# Patient Record
Sex: Female | Born: 1995 | Race: Black or African American | Hispanic: No | Marital: Single | Smoking: Never smoker
Health system: Southern US, Community
[De-identification: ages and names within clinical notes are randomized; demographics above are authoritative.]

## PROBLEM LIST (undated history)

## (undated) DIAGNOSIS — F319 Bipolar disorder, unspecified: Secondary | ICD-10-CM

## (undated) DIAGNOSIS — F909 Attention-deficit hyperactivity disorder, unspecified type: Secondary | ICD-10-CM

---
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
## 2014-03-25 ENCOUNTER — Emergency Department (HOSPITAL_COMMUNITY)
Admission: EM | Admit: 2014-03-25 | Discharge: 2014-03-26 | Disposition: A | Discharge status: Home / Self Care | Payer: No Typology Code available for payment source | Source: Home / Self Care | Attending: Emergency Medicine | Admitting: Emergency Medicine

## 2014-03-25 ENCOUNTER — Emergency Department (HOSPITAL_COMMUNITY): Payer: No Typology Code available for payment source

## 2014-03-25 ENCOUNTER — Encounter (HOSPITAL_COMMUNITY): Payer: Self-pay | Admitting: Emergency Medicine

## 2014-03-25 ENCOUNTER — Emergency Department (HOSPITAL_COMMUNITY)
Admission: EM | Admit: 2014-03-25 | Discharge: 2014-03-25 | Disposition: A | Discharge status: Home / Self Care | Payer: No Typology Code available for payment source | Attending: Emergency Medicine | Admitting: Emergency Medicine

## 2014-03-25 DIAGNOSIS — S81009A Unspecified open wound, unspecified knee, initial encounter: Secondary | ICD-10-CM | POA: Insufficient documentation

## 2014-03-25 DIAGNOSIS — Z79899 Other long term (current) drug therapy: Secondary | ICD-10-CM

## 2014-03-25 DIAGNOSIS — G8911 Acute pain due to trauma: Secondary | ICD-10-CM | POA: Insufficient documentation

## 2014-03-25 DIAGNOSIS — F319 Bipolar disorder, unspecified: Secondary | ICD-10-CM | POA: Insufficient documentation

## 2014-03-25 DIAGNOSIS — S93401A Sprain of unspecified ligament of right ankle, initial encounter: Secondary | ICD-10-CM

## 2014-03-25 DIAGNOSIS — W540XXA Bitten by dog, initial encounter: Secondary | ICD-10-CM

## 2014-03-25 DIAGNOSIS — S91051A Open bite, right ankle, initial encounter: Secondary | ICD-10-CM

## 2014-03-25 DIAGNOSIS — Y9389 Activity, other specified: Secondary | ICD-10-CM | POA: Insufficient documentation

## 2014-03-25 DIAGNOSIS — S81809A Unspecified open wound, unspecified lower leg, initial encounter: Principal | ICD-10-CM

## 2014-03-25 DIAGNOSIS — S93409A Sprain of unspecified ligament of unspecified ankle, initial encounter: Secondary | ICD-10-CM | POA: Insufficient documentation

## 2014-03-25 DIAGNOSIS — M25579 Pain in unspecified ankle and joints of unspecified foot: Secondary | ICD-10-CM

## 2014-03-25 DIAGNOSIS — Y929 Unspecified place or not applicable: Secondary | ICD-10-CM | POA: Insufficient documentation

## 2014-03-25 DIAGNOSIS — S91009A Unspecified open wound, unspecified ankle, initial encounter: Principal | ICD-10-CM

## 2014-03-25 DIAGNOSIS — Z23 Encounter for immunization: Secondary | ICD-10-CM | POA: Insufficient documentation

## 2014-03-25 DIAGNOSIS — M25571 Pain in right ankle and joints of right foot: Secondary | ICD-10-CM

## 2014-03-25 MED ORDER — AMOXICILLIN-POT CLAVULANATE 875-125 MG PO TABS: 1.0000 | ORAL_TABLET | Freq: Two times a day (BID) | ORAL | Status: AC

## 2014-03-25 MED ORDER — TETANUS-DIPHTH-ACELL PERTUSSIS 5-2.5-18.5 LF-MCG/0.5 IM SUSP
0.5000 mL | Freq: Once | INTRAMUSCULAR | Status: AC
  Administered 2014-03-25: 0.5 mL via INTRAMUSCULAR
  Filled 2014-03-25: qty 0.5

## 2014-03-25 MED ORDER — TRAMADOL HCL 50 MG PO TABS: 50.0000 mg | ORAL_TABLET | Freq: Four times a day (QID) | ORAL | Status: DC | PRN

## 2014-03-25 NOTE — ED Provider Notes (Signed)
CSN: [id number]     Arrival date & time 03/25/14  1659 History  This chart was scribed for non-physician practitioner, Junius FinnerErin O'Malley, PA-C, working with Nelia Shiobert L Beaton, MD by Smiley HousemanFallon Davis, ED Scribe. This patient was seen in room WTR8/WTR8 and the patient's care was started at 6:22 PM.     Chief Complaint  Patient presents with  . Animal Bite   The history is provided by the patient. No language interpreter was used.   HPI Comments: Tami Garza is a 18 y.o. female who presents to the Emergency Department complaining of an animal bite to her right ankle that occurred about an hour ago.  Pt was bit by her [employer] and states it is UTD on its vaccinations.  She states she was arguing with her aunt when the dog attempted to protect her and bit her multiple times.  Pt arrived by EMS with a dressing to her right ankle.  She is ambulatory.  She states her pain is a 5 out of 10, aching and sharp.  She reports it is difficult to extend her foot forward due to the swelling.  She states she had a previous sprain to her right ankle.  Pt reports her tetanus is not UTD.    Past Medical History  Diagnosis Date  . ADHD (attention deficit hyperactivity disorder)   . Bipolar disorder    History reviewed. No pertinent past surgical history. No family history on file. History  Substance Use Topics  . Smoking status: Never Smoker   . Smokeless tobacco: Not on file  . Alcohol Use: Yes   OB History   Grav Para Term Preterm Abortions TAB SAB Ect Mult Living                 Review of Systems  Constitutional: Negative for fever and chills.  Musculoskeletal: Positive for arthralgias (Right ankle) and joint swelling (Right ankle).  Skin: Positive for wound (Animal Bite). Negative for color change and rash.  Psychiatric/Behavioral: Negative for behavioral problems and confusion.  All other systems reviewed and are negative.   Allergies  Review of patient's allergies indicates no known  allergies.  Home Medications   Current Outpatient Rx  Name  Route  Sig  Dispense  Refill  . amoxicillin-clavulanate (AUGMENTIN) 875-125 MG per tablet   Oral   Take 1 tablet by mouth every 12 (twelve) hours.   14 tablet   0   . ARIPiprazole (ABILIFY) 10 MG tablet   Oral   Take 10 mg by mouth every morning.         . Oxcarbazepine (TRILEPTAL) 300 MG tablet   Oral   Take 600 mg by mouth 2 (two) times daily.          . traZODone (DESYREL) 50 MG tablet   Oral   Take 25 mg by mouth at bedtime as needed for sleep.          Triage Vitals: BP 116/81  Pulse 67  Temp(Src) 99 F (37.2 C) (Oral)  Resp 16  SpO2 100%  Physical Exam  Nursing note and vitals reviewed. Constitutional: She is oriented to person, place, and time. She appears well-developed and well-nourished. No distress.  HENT:  Head: Normocephalic and atraumatic.  Eyes: Conjunctivae and EOM are normal. Right eye exhibits no discharge. Left eye exhibits no discharge. No scleral icterus.  Neck: Normal range of motion.  Cardiovascular: Normal rate, regular rhythm and normal heart sounds.   Pulses:  Dorsalis pedis pulses are 2+ on the right side.       Posterior tibial pulses are 2+ on the right side.  Pulmonary/Chest: Effort normal and breath sounds normal. No respiratory distress. She has no wheezes. She has no rales. She exhibits no tenderness.  Abdominal: Soft. She exhibits no distension.  Musculoskeletal:       Right ankle: She exhibits decreased range of motion, swelling and laceration. She exhibits no deformity and normal pulse.  Neurological: She is alert and oriented to person, place, and time.  Skin: Skin is warm and dry. No rash noted. She is not diaphoretic.  Puncture wounds to anterior right ankle and posterior lateral right heel.  No active bleeding.      ED Course  Procedures (including critical care time) DIAGNOSTIC STUDIES: Oxygen Saturation is 100% on RA, normal by my interpretation.     COORDINATION OF CARE: 6:29 PM-Will order x-ray of right ankle and Tdap.  Patient informed of current plan of treatment and evaluation and agrees with plan.    The wound is cleansed, debrided of foreign material as much as possible, and dressed. The patient is alerted to watch for any signs of infection (redness, pus, pain, increased swelling or fever) and call if such occurs. Home wound care instructions are provided. Tetanus vaccination status reviewed: Tetanus was given today.    Imaging Review Dg Ankle Complete Right  03/25/2014   CLINICAL DATA:  Dog bite in the ankle.  EXAM: RIGHT ANKLE - COMPLETE 3+ VIEW  COMPARISON:  None.  FINDINGS: Spurring of the dorsal talar neck. No fracture or retained foreign body. No malleolar fracture. Talar dome intact.  IMPRESSION: 1. No fracture or retained foreign body. 2. Spurring of the dorsal talar neck.   Electronically Signed   By: Herbie Baltimore M.D.   On: 03/25/2014 19:19     MDM   Final diagnoses:  Dog bite of right ankle  Sprain of ankle, right   Pt presenting with dog bite to right ankle with associated ankle pain, swelling, and puncture wounds. Plain films: no fracture or retained foreign body.  Wounds cleansed and covered. Crutches given for comfort of apparent right ankle sprain. Pt has cam walker boot at home. Advised pt if she decides to use for ankle pain, to keep wounds cleaned and covered while wearing and to change bandages 2-3 times daily.  Rx: augmentin.  Tetanus given in ED. Return precautions provided. Pt verbalized understanding and agreement with tx plan.   I personally performed the services described in this documentation, which was scribed in my presence. The recorded information has been reviewed and is accurate.      Junius Finner, PA-C 03/26/14 1212

## 2014-03-25 NOTE — Discharge Instructions (Signed)
Animal Bite Animal bite wounds can get infected. It is important to get proper medical treatment. Ask your doctor if you need a rabies shot. HOME CARE   Follow your doctor's instructions for taking care of your wound.  Only take medicine as told by your doctor.  Take your medicine (antibiotics) as told. Finish them even if you start to feel better.  Keep all doctor visits as told. You may need a tetanus shot if:   You cannot remember when you had your last tetanus shot.  You have never had a tetanus shot.  The injury broke your skin. If you need a tetanus shot and you choose not to have one, you may get tetanus. Sickness from tetanus can be serious. GET HELP RIGHT AWAY IF:   Your wound is warm, red, sore, or puffy (swollen).  You notice yellowish-white fluid (pus) or a bad smell coming from the wound.  You see a red line on the skin coming from the wound.  You have a fever, chills, or you feel sick.  You feel sick to your stomach (nauseous), or you throw up (vomit).  Your pain does not go away, or it gets worse.  You have trouble moving the injured part.  You have questions or concerns. MAKE SURE YOU:   Understand these instructions.  Will watch your condition.  Will get help right away if you are not doing well or get worse. Document Released: 12/16/2005 Document Revised: 03/09/2012 Document Reviewed: 08/07/2011 [facility] Patient Information 2014 [place], [place].  Acute Ankle Sprain with Phase I Rehab An acute ankle sprain is a partial or complete tear in one or more of the ligaments of the ankle due to traumatic injury. The severity of the injury depends on both the the number of ligaments sprained and the grade of sprain. There are 3 grades of sprains.   A grade 1 sprain is a mild sprain. There is a slight pull without obvious tearing. There is no loss of strength, and the muscle and ligament are the correct length.  A grade 2 sprain is a moderate sprain. There  is tearing of fibers within the substance of the ligament where it connects two bones or two cartilages. The length of the ligament is increased, and there is usually decreased strength.  A grade 3 sprain is a complete rupture of the ligament and is uncommon. In addition to the grade of sprain, there are three types of ankle sprains.  Lateral ankle sprains: This is a sprain of one or more of the three ligaments on the outer side (lateral) of the ankle. These are the most common sprains. Medial ankle sprains: There is one large triangular ligament of the inner side (medial) of the ankle that is susceptible to injury. Medial ankle sprains are less common. Syndesmosis, "high ankle," sprains: The syndesmosis is the ligament that connects the two bones of the lower leg. Syndesmosis sprains usually only occur with very severe ankle sprains. SYMPTOMS  Pain, tenderness, and swelling in the ankle, starting at the side of injury that may progress to the whole ankle and foot with time.  "Pop" or tearing sensation at the time of injury.  Bruising that may spread to the heel.  Impaired ability to walk soon after injury. CAUSES   Acute ankle sprains are caused by trauma placed on the ankle that temporarily forces or pries the anklebone (talus) out of its normal socket.  Stretching or tearing of the ligaments that normally hold the joint in  place (usually due to a twisting injury). RISK INCREASES WITH:  Previous ankle sprain.  Sports in which the foot may land awkwardly (ie. basketball, volleyball, or soccer) or walking or running on uneven or rough surfaces.  Shoes with inadequate support to prevent sideways motion when stress occurs.  Poor strength and flexibility.  Poor balance skills.  Contact sports. PREVENTION   Warm up and stretch properly before activity.  Maintain physical fitness:  Ankle and leg flexibility, muscle strength, and endurance.  Cardiovascular fitness.  Balance  training activities.  Use proper technique and have a coach correct improper technique.  Taping, protective strapping, bracing, or high-top tennis shoes may help prevent injury. Initially, tape is best; however, it loses most of its support function within 10 to 15 minutes.  Wear proper fitted protective shoes (High-top shoes with taping or bracing is more effective than either alone).  Provide the ankle with support during sports and practice activities for 12 months following injury. PROGNOSIS   If treated properly, ankle sprains can be expected to recover completely; however, the length of recovery depends on the degree of injury.  A grade 1 sprain usually heals enough in 5 to 7 days to allow modified activity and requires an average of 6 weeks to heal completely.  A grade 2 sprain requires 6 to 10 weeks to heal completely.  A grade 3 sprain requires 12 to 16 weeks to heal.  A syndesmosis sprain often takes more than 3 months to heal. RELATED COMPLICATIONS   Frequent recurrence of symptoms may result in a chronic problem. Appropriately addressing the problem the first time decreases the frequency of recurrence and optimizes healing time. Severity of the initial sprain does not predict the likelihood of later instability.  Injury to other structures (bone, cartilage, or tendon).  A chronically unstable or arthritic ankle joint is a possiblity with repeated sprains. TREATMENT Treatment initially involves the use of ice, medication, and compression bandages to help reduce pain and inflammation. Ankle sprains are usually immobilized in a walking cast or boot to allow for healing. Crutches may be recommended to reduce pressure on the injury. After immobilization, strengthening and stretching exercises may be necessary to regain strength and a full range of motion. Surgery is rarely needed to treat ankle sprains. MEDICATION   Nonsteroidal anti-inflammatory medications, such as aspirin and  ibuprofen (do not take for the first 3 days after injury or within 7 days before surgery), or other minor pain relievers, such as acetaminophen, are often recommended. Take these as directed by your caregiver. Contact your caregiver immediately if any bleeding, stomach upset, or signs of an allergic reaction occur from these medications.  Ointments applied to the skin may be helpful.  Pain relievers may be prescribed as necessary by your caregiver. Do not take prescription pain medication for longer than 4 to 7 days. Use only as directed and only as much as you need. HEAT AND COLD  Cold treatment (icing) is used to relieve pain and reduce inflammation for acute and chronic cases. Cold should be applied for 10 to 15 minutes every 2 to 3 hours for inflammation and pain and immediately after any activity that aggravates your symptoms. Use ice packs or an ice massage.  Heat treatment may be used before performing stretching and strengthening activities prescribed by your caregiver. Use a heat pack or a warm soak. SEEK IMMEDIATE MEDICAL CARE IF:   Pain, swelling, or bruising worsens despite treatment.  You experience pain, numbness, discoloration, or coldness  in the foot or toes.  New, unexplained symptoms develop (drugs used in treatment may produce side effects.) EXERCISES  PHASE I EXERCISES RANGE OF MOTION (ROM) AND STRETCHING EXERCISES - Ankle Sprain, Acute Phase I, Weeks 1 to 2 These exercises may help you when beginning to restore flexibility in your ankle. You will likely work on these exercises for the 1 to 2 weeks after your injury. Once your physician, physical therapist, or athletic trainer sees adequate progress, he or she will advance your exercises. While completing these exercises, remember:   Restoring tissue flexibility helps normal motion to return to the joints. This allows healthier, less painful movement and activity.  An effective stretch should be held for at least 30  seconds.  A stretch should never be painful. You should only feel a gentle lengthening or release in the stretched tissue. RANGE OF MOTION - Dorsi/Plantar Flexion  While sitting with your right / left knee straight, draw the top of your foot upwards by flexing your ankle. Then reverse the motion, pointing your toes downward.  Hold each position for __________ seconds.  After completing your first set of exercises, repeat this exercise with your knee bent. Repeat __________ times. Complete this exercise __________ times per day.  RANGE OF MOTION - Ankle Alphabet  Imagine your right / left big toe is a pen.  Keeping your hip and knee still, write out the entire alphabet with your "pen." Make the letters as large as you can without increasing any discomfort. Repeat __________ times. Complete this exercise __________ times per day.  STRENGTHENING EXERCISES - Ankle Sprain, Acute -Phase I, Weeks 1 to 2 These exercises may help you when beginning to restore strength in your ankle. You will likely work on these exercises for 1 to 2 weeks after your injury. Once your physician, physical therapist, or athletic trainer sees adequate progress, he or she will advance your exercises. While completing these exercises, remember:   Muscles can gain both the endurance and the strength needed for everyday activities through controlled exercises.  Complete these exercises as instructed by your physician, physical therapist, or athletic trainer. Progress the resistance and repetitions only as guided.  You may experience muscle soreness or fatigue, but the pain or discomfort you are trying to eliminate should never worsen during these exercises. If this pain does worsen, stop and make certain you are following the directions exactly. If the pain is still present after adjustments, discontinue the exercise until you can discuss the trouble with your clinician. STRENGTH - Dorsiflexors  Secure a rubber exercise  band/tubing to a fixed object (ie. table, pole) and loop the other end around your right / left foot.  Sit on the floor facing the fixed object. The band/tubing should be slightly tense when your foot is relaxed.  Slowly draw your foot back toward you using your ankle and toes.  Hold this position for __________ seconds. Slowly release the tension in the band and return your foot to the starting position. Repeat __________ times. Complete this exercise __________ times per day.  STRENGTH - Plantar-flexors   Sit with your right / left leg extended. Holding onto both ends of a rubber exercise band/tubing, loop it around the ball of your foot. Keep a slight tension in the band.  Slowly push your toes away from you, pointing them downward.  Hold this position for __________ seconds. Return slowly, controlling the tension in the band/tubing. Repeat __________ times. Complete this exercise __________ times per day.  STRENGTH -  Ankle Eversion  Secure one end of a rubber exercise band/tubing to a fixed object (table, pole). Loop the other end around your foot just before your toes.  Place your fists between your knees. This will focus your strengthening at your ankle.  Drawing the band/tubing across your opposite foot, slowly, pull your little toe out and up. Make sure the band/tubing is positioned to resist the entire motion.  Hold this position for __________ seconds. Have your muscles resist the band/tubing as it slowly pulls your foot back to the starting position.  Repeat __________ times. Complete this exercise __________ times per day.  STRENGTH - Ankle Inversion  Secure one end of a rubber exercise band/tubing to a fixed object (table, pole). Loop the other end around your foot just before your toes.  Place your fists between your knees. This will focus your strengthening at your ankle.  Slowly, pull your big toe up and in, making sure the band/tubing is positioned to resist the  entire motion.  Hold this position for __________ seconds.  Have your muscles resist the band/tubing as it slowly pulls your foot back to the starting position. Repeat __________ times. Complete this exercises __________ times per day.  STRENGTH - Towel Curls  Sit in a chair positioned on a non-carpeted surface.  Place your right / left foot on a towel, keeping your heel on the floor.  Pull the towel toward your heel by only curling your toes. Keep your heel on the floor.  If instructed by your physician, physical therapist, or athletic trainer, add weight to the end of the towel. Repeat __________ times. Complete this exercise __________ times per day. Document Released: 07/17/2005 Document Revised: 03/09/2012 Document Reviewed: 03/30/2009 [facility] Patient Information 2014 [place], [place].

## 2014-03-25 NOTE — ED Notes (Signed)
Patient was seen earlier today for a sprained ankle and she states that she needs a boot for her ankle due to she  Needs the support to talk

## 2014-03-25 NOTE — ED Notes (Signed)
Per EMS-was fighting with mother when dog bite both of them-small puncture wound to right ankle

## 2014-03-25 NOTE — ED Notes (Signed)
Wound irrigated by PA. Wound dressed with bacitracin and band-aids. Pt instructed on further wound care.

## 2014-03-25 NOTE — ED Notes (Signed)
Pt BIB EMS. Pt states she was bit in her R ankle by a family's pit bull. Dog is up to date on vaccinations. Pt has puncture wounds to R ankle. Pt arrives with dressing in tact to R ankle. Pt ambulatory to exam room with steady gait. Pt is unsure of her last tetanus shot. Thinks it is 10 yrs ago.

## 2014-03-26 ENCOUNTER — Encounter (HOSPITAL_COMMUNITY): Payer: Self-pay | Admitting: Emergency Medicine

## 2014-03-26 ENCOUNTER — Emergency Department (HOSPITAL_COMMUNITY)
Admission: EM | Admit: 2014-03-26 | Discharge: 2014-03-26 | Disposition: A | Discharge status: Home / Self Care | Payer: No Typology Code available for payment source | Attending: Emergency Medicine | Admitting: Emergency Medicine

## 2014-03-26 DIAGNOSIS — Z3202 Encounter for pregnancy test, result negative: Secondary | ICD-10-CM | POA: Insufficient documentation

## 2014-03-26 DIAGNOSIS — Z8659 Personal history of other mental and behavioral disorders: Secondary | ICD-10-CM | POA: Insufficient documentation

## 2014-03-26 DIAGNOSIS — Z79899 Other long term (current) drug therapy: Secondary | ICD-10-CM | POA: Insufficient documentation

## 2014-03-26 DIAGNOSIS — N949 Unspecified condition associated with female genital organs and menstrual cycle: Secondary | ICD-10-CM | POA: Insufficient documentation

## 2014-03-26 DIAGNOSIS — F319 Bipolar disorder, unspecified: Secondary | ICD-10-CM | POA: Insufficient documentation

## 2014-03-26 DIAGNOSIS — Z792 Long term (current) use of antibiotics: Secondary | ICD-10-CM | POA: Insufficient documentation

## 2014-03-26 DIAGNOSIS — N938 Other specified abnormal uterine and vaginal bleeding: Secondary | ICD-10-CM | POA: Insufficient documentation

## 2014-03-26 DIAGNOSIS — N925 Other specified irregular menstruation: Secondary | ICD-10-CM | POA: Insufficient documentation

## 2014-03-26 LAB — POC URINE PREG, ED: PREG TEST UR: NEGATIVE

## 2014-03-26 NOTE — ED Provider Notes (Signed)
Medical screening examination/treatment/procedure(s) were performed by non-physician practitioner and as supervising physician I was immediately available for consultation/collaboration.    Kekoa Fyock L Akaila Rambo, MD 03/26/14 1523 

## 2014-03-26 NOTE — ED Provider Notes (Signed)
CSN: [id number]     Arrival date & time 03/26/14  [id number] History   First MD Initiated Contact with Patient 03/26/14 0359     Chief Complaint  Patient presents with  . Possible Pregnancy      HPI Patient believes she is 4 days late on her menstrual cycle.  She presents requesting a pregnancy test.  She reports some occasional abdominal cramping over the past several days.  No vaginal spotting.  No lightheadedness or weakness.  No abdominal pain at this time.  No nausea or vomiting.   Past Medical History  Diagnosis Date  . ADHD (attention deficit hyperactivity disorder)   . Bipolar disorder    History reviewed. No pertinent past surgical history. History reviewed. No pertinent family history. History  Substance Use Topics  . Smoking status: Never Smoker   . Smokeless tobacco: Not on file  . Alcohol Use: Yes   OB History   Grav Para Term Preterm Abortions TAB SAB Ect Mult Living                 Review of Systems  All other systems reviewed and are negative.      Allergies  Review of patient's allergies indicates no known allergies.  Home Medications   Current Outpatient Rx  Name  Route  Sig  Dispense  Refill  . ARIPiprazole (ABILIFY) 10 MG tablet   Oral   Take 10 mg by mouth every morning.         . Oxcarbazepine (TRILEPTAL) 300 MG tablet   Oral   Take 600 mg by mouth 2 (two) times daily.          . traZODone (DESYREL) 50 MG tablet   Oral   Take 25 mg by mouth at bedtime as needed for sleep.         Marland Kitchen. amoxicillin-clavulanate (AUGMENTIN) 875-125 MG per tablet   Oral   Take 1 tablet by mouth every 12 (twelve) hours.   14 tablet   0    BP 121/75  Pulse 70  Temp(Src) 97.7 F (36.5 C) (Oral)  SpO2 98%  LMP 02/24/2014 Physical Exam  Nursing note and vitals reviewed. Constitutional: She is oriented to person, place, and time. She appears well-developed and well-nourished.  HENT:  Head: Normocephalic.  Eyes: EOM are normal.  Neck: Normal range of  motion.  Pulmonary/Chest: Effort normal.  Abdominal: She exhibits no distension. There is no tenderness.  Musculoskeletal: Normal range of motion.  Neurological: She is alert and oriented to person, place, and time.  Psychiatric: She has a normal mood and affect.    ED Course  Procedures (including critical care time) Labs Review Labs Reviewed  POC URINE PREG, ED   Imaging Review    EKG Interpretation None      MDM   Final diagnoses:  Dysfunctional uterine bleeding        Lyanne CoKevin M Ainslee Sou, MD 03/26/14 [phone]

## 2014-03-26 NOTE — ED Provider Notes (Signed)
Medical screening examination/treatment/procedure(s) were performed by non-physician practitioner and as supervising physician I was immediately available for consultation/collaboration.   EKG Interpretation None        Kerina Simoneau M Avriel Kandel, MD 03/26/14 0441 

## 2014-03-26 NOTE — ED Notes (Signed)
Patient wants a pregnancy test because she is 4 days late

## 2014-03-26 NOTE — Discharge Instructions (Signed)
Followup with your primary care provider for continued evaluation and treatment. Take the medications prescribed as instructed.    Animal Bite An animal bite can result in a scratch on the skin, deep open cut, puncture of the skin, crush injury, or tearing away of the skin or a body part. Dogs are responsible for most animal bites. Children are bitten more often than adults. An animal bite can range from very mild to more serious. A small bite from your house pet is no cause for alarm. However, some animal bites can become infected or injure a bone or other tissue. You must seek medical care if:  The skin is broken and bleeding does not slow down or stop after 15 minutes.  The puncture is deep and difficult to clean (such as a cat bite).  Pain, warmth, redness, or pus develops around the wound.  The bite is from a stray animal or rodent. There may be a risk of rabies infection.  The bite is from a snake, raccoon, skunk, fox, coyote, or bat. There may be a risk of rabies infection.  The person bitten has a chronic illness such as diabetes, liver disease, or cancer, or the person takes medicine that lowers the immune system.  There is concern about the location and severity of the bite. It is important to clean and protect an animal bite wound right away to prevent infection. Follow these steps:  Clean the wound with plenty of water and soap.  Apply an antibiotic cream.  Apply gentle pressure over the wound with a clean towel or gauze to slow or stop bleeding.  Elevate the affected area above the heart to help stop any bleeding.  Seek medical care. Getting medical care within 8 hours of the animal bite leads to the best possible outcome. DIAGNOSIS  Your caregiver will most likely:  Take a detailed history of the animal and the bite injury.  Perform a wound exam.  Take your medical history. Blood tests or X-rays may be performed. Sometimes, infected bite wounds are cultured and  sent to a lab to identify the infectious bacteria.  TREATMENT  Medical treatment will depend on the location and type of animal bite as well as the patient's medical history. Treatment may include:  Wound care, such as cleaning and flushing the wound with saline solution, bandaging, and elevating the affected area.  Antibiotics.  Tetanus immunization.  Rabies immunization.  Leaving the wound open to heal. This is often done with animal bites, due to the high risk of infection. However, in certain cases, wound closure with stitches, wound adhesive, skin adhesive strips, or staples may be used. Infected bites that are left untreated may require intravenous (IV) antibiotics and surgical treatment in the hospital. HOME CARE INSTRUCTIONS  Follow your caregiver's instructions for wound care.  Take all medicines as directed.  If your caregiver prescribes antibiotics, take them as directed. Finish them even if you start to feel better.  Follow up with your caregiver for further exams or immunizations as directed. You may need a tetanus shot if:  You cannot remember when you had your last tetanus shot.  You have never had a tetanus shot.  The injury broke your skin. If you get a tetanus shot, your arm may swell, get red, and feel warm to the touch. This is common and not a problem. If you need a tetanus shot and you choose not to have one, there is a rare chance of getting tetanus. Sickness from  tetanus can be serious. SEEK MEDICAL CARE IF:  You notice warmth, redness, soreness, swelling, pus discharge, or a bad smell coming from the wound.  You have a red line on the skin coming from the wound.  You have a fever, chills, or a general ill feeling.  You have nausea or vomiting.  You have continued or worsening pain.  You have trouble moving the injured part.  You have other questions or concerns. MAKE SURE YOU:  Understand these instructions.  Will watch your  condition.  Will get help right away if you are not doing well or get worse. Document Released: 09/03/2011 Document Revised: 03/09/2012 Document Reviewed: 09/03/2011 [facility] Patient Information 2014 [place], [place].    Ankle Pain Ankle pain is a common symptom. The bones, cartilage, tendons, and muscles of the ankle joint perform a lot of work each day. The ankle joint holds your body weight and allows you to move around. Ankle pain can occur on either side or back of 1 or both ankles. Ankle pain may be sharp and burning or dull and aching. There may be tenderness, stiffness, redness, or warmth around the ankle. The pain occurs more often when a person walks or puts pressure on the ankle. CAUSES  There are many reasons ankle pain can develop. It is important to work with your caregiver to identify the cause since many conditions can impact the bones, cartilage, muscles, and tendons. Causes for ankle pain include:  Injury, including a break (fracture), sprain, or strain often due to a fall, sports, or a high-impact activity.  Swelling (inflammation) of a tendon (tendonitis).  Achilles tendon rupture.  Ankle instability after repeated sprains and strains.  Poor foot alignment.  Pressure on a nerve (tarsal tunnel syndrome).  Arthritis in the ankle or the lining of the ankle.  Crystal formation in the ankle (gout or pseudogout). DIAGNOSIS  A diagnosis is based on your medical history, your symptoms, results of your physical exam, and results of diagnostic tests. Diagnostic tests may include X-ray exams or a computerized magnetic scan (magnetic resonance imaging, MRI). TREATMENT  Treatment will depend on the cause of your ankle pain and may include:  Keeping pressure off the ankle and limiting activities.  Using crutches or other walking support (a cane or brace).  Using rest, ice, compression, and elevation.  Participating in physical therapy or home exercises.  Wearing shoe  inserts or special shoes.  Losing weight.  Taking medications to reduce pain or swelling or receiving an injection.  Undergoing surgery. HOME CARE INSTRUCTIONS   Only take over-the-counter or prescription medicines for pain, discomfort, or fever as directed by your caregiver.  Put ice on the injured area.  Put ice in a plastic bag.  Place a towel between your skin and the bag.  Leave the ice on for 15-20 minutes at a time, 03-04 times a day.  Keep your leg raised (elevated) when possible to lessen swelling.  Avoid activities that cause ankle pain.  Follow specific exercises as directed by your caregiver.  Record how often you have ankle pain, the location of the pain, and what it feels like. This information may be helpful to you and your caregiver.  Ask your caregiver about returning to work or sports and whether you should drive.  Follow up with your caregiver for further examination, therapy, or testing as directed. SEEK MEDICAL CARE IF:   Pain or swelling continues or worsens beyond 1 week.  You have an oral temperature above 102  F (38.9 C).  You are feeling unwell or have chills.  You are having an increasingly difficult time with walking.  You have loss of sensation or other new symptoms.  You have questions or concerns. MAKE SURE YOU:   Understand these instructions.  Will watch your condition.  Will get help right away if you are not doing well or get worse. Document Released: 06/05/2010 Document Revised: 03/09/2012 Document Reviewed: 06/05/2010 [facility] Patient Information 2014 [place], [place].

## 2014-03-26 NOTE — ED Provider Notes (Signed)
CSN: [id number]     Arrival date & time 03/25/14  2317 History   First MD Initiated Contact with Patient 03/26/14 0009     Chief Complaint  Patient presents with  . Foot Injury   HPI  History provided by the patient and recent medical chart. Patient is 18 year old female who was seen earlier today in emergency department for dog bites to the right ankle. She was treated with wound care, tetanus shot and prescriptions for antibiotics. She states that she was offered to have a wrap around her ankle to help but she thought she had a brace at home and when she returned found out it was for her left foot and did not hit her right side. She returned stating that it is uncomfortable to walk and she wishes to have a brace on her right foot. She does have crutches already at home. No other changes in symptoms. No aggravating or alleviating factors. No other associated symptoms.   Past Medical History  Diagnosis Date  . ADHD (attention deficit hyperactivity disorder)   . Bipolar disorder    History reviewed. No pertinent past surgical history. History reviewed. No pertinent family history. History  Substance Use Topics  . Smoking status: Never Smoker   . Smokeless tobacco: Not on file  . Alcohol Use: Yes   OB History   Grav Para Term Preterm Abortions TAB SAB Ect Mult Living                 Review of Systems  All other systems reviewed and are negative.      Allergies  Review of patient's allergies indicates no known allergies.  Home Medications   Current Outpatient Rx  Name  Route  Sig  Dispense  Refill  . amoxicillin-clavulanate (AUGMENTIN) 875-125 MG per tablet   Oral   Take 1 tablet by mouth every 12 (twelve) hours.   14 tablet   0   . ARIPiprazole (ABILIFY) 15 MG tablet   Oral   Take 15 mg by mouth daily.         . methylphenidate (CONCERTA) 36 MG CR tablet   Oral   Take 36 mg by mouth every morning.         . Oxcarbazepine (TRILEPTAL) 300 MG tablet   Oral    Take 300 mg by mouth 3 (three) times daily.         . traMADol (ULTRAM) 50 MG tablet   Oral   Take 1 tablet (50 mg total) by mouth every 6 (six) hours as needed.   15 tablet   0    BP 135/78  Pulse 76  Temp(Src) 98.3 F (36.8 C) (Oral)  Resp 20  Wt 237 lb 9.6 oz (107.775 kg)  SpO2 100%  LMP 02/24/2014 Physical Exam  Nursing note and vitals reviewed. Constitutional: She is oriented to person, place, and time. She appears well-developed and well-nourished. No distress.  HENT:  Head: Normocephalic.  Cardiovascular: Normal rate and regular rhythm.   Pulmonary/Chest: Effort normal and breath sounds normal.  Musculoskeletal:  Multiple puncture wounds to the right foot and ankle consistent with dog bite. There is associated swelling with tenderness. Normal dorsal pedal pulses. Normal sensation capillary refill in all toes. No active bleeding  Neurological: She is alert and oriented to person, place, and time.  Skin: Skin is warm and dry. No rash noted.  Psychiatric: She has a normal mood and affect. Her behavior is normal.    ED Course  Procedures   DIAGNOSTIC STUDIES: Oxygen Saturation is 100% on room air.    COORDINATION OF CARE:  Nursing notes reviewed. Vital signs reviewed. Initial pt interview and examination performed.   12:34 AM-patient seen and evaluated. No changes in symptoms. We'll provide ASO and postop shoe. Patient agrees with plan.  Imaging Review Dg Ankle Complete Right  03/25/2014   CLINICAL DATA:  Dog bite in the ankle.  EXAM: RIGHT ANKLE - COMPLETE 3+ VIEW  COMPARISON:  None.  FINDINGS: Spurring of the dorsal talar neck. No fracture or retained foreign body. No malleolar fracture. Talar dome intact.  IMPRESSION: 1. No fracture or retained foreign body. 2. Spurring of the dorsal talar neck.   Electronically Signed   By: Herbie Baltimore M.D.   On: 03/25/2014 19:19     MDM   Final diagnoses:  Dog bite  Ankle pain, right       Angus Seller,  PA-C 03/26/14 [id number]

## 2015-04-29 IMAGING — CR DG FOOT COMPLETE 3+V*L*
3 series · 3 of 3 positions shown · non-contrast
Comparison: None.

CLINICAL DATA: Pain post trauma

EXAM:
LEFT FOOT - COMPLETE 3+ VIEW

[x foot ap left]
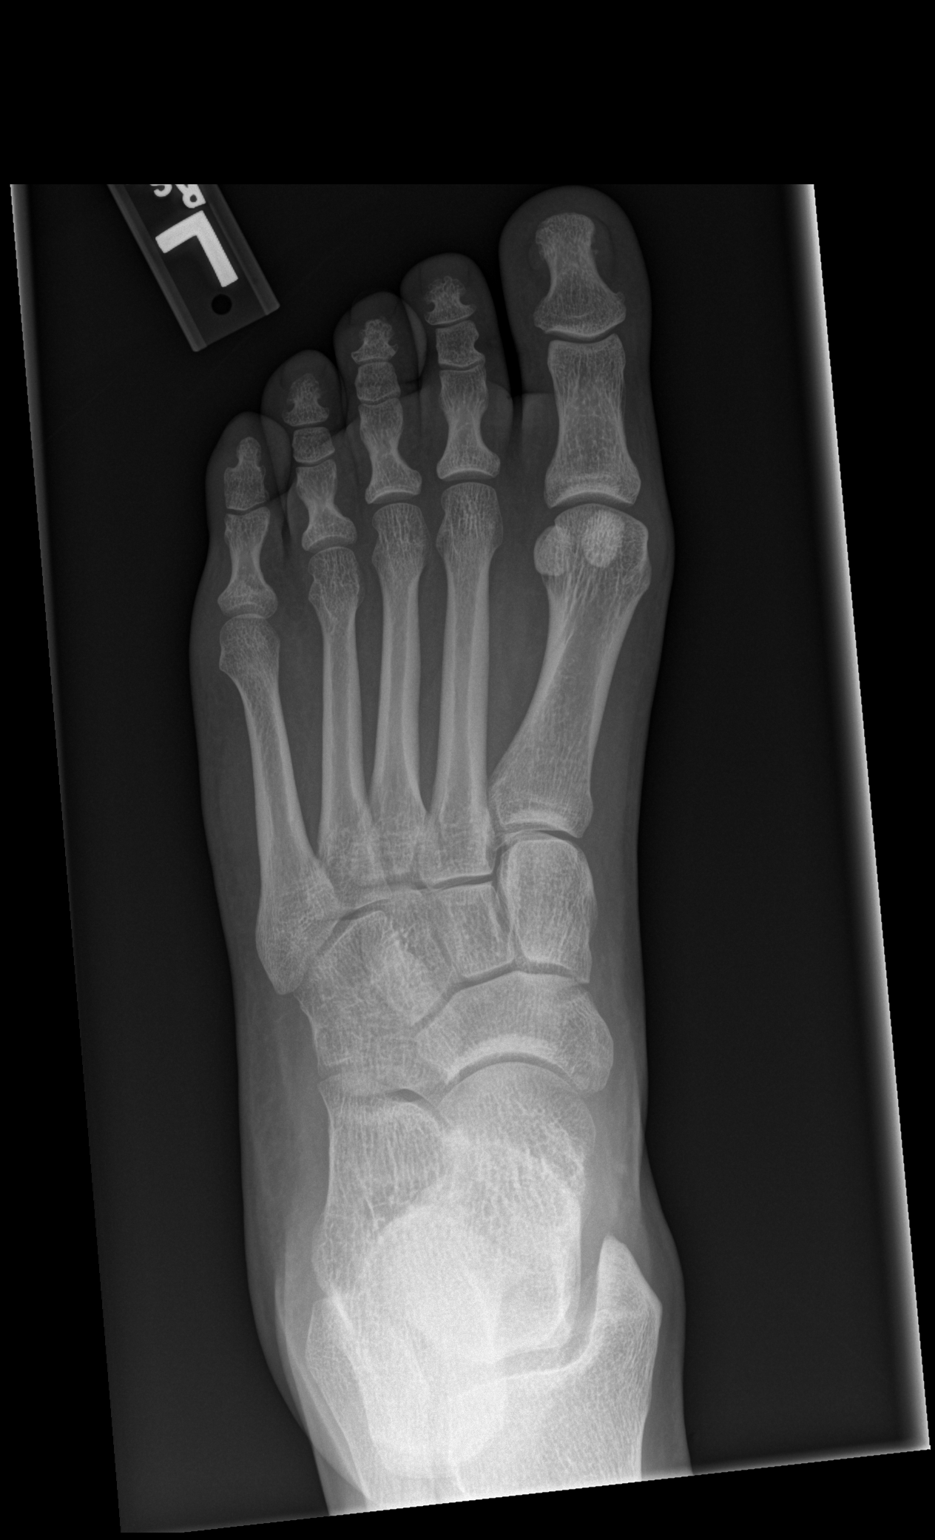

[x foot obl left]
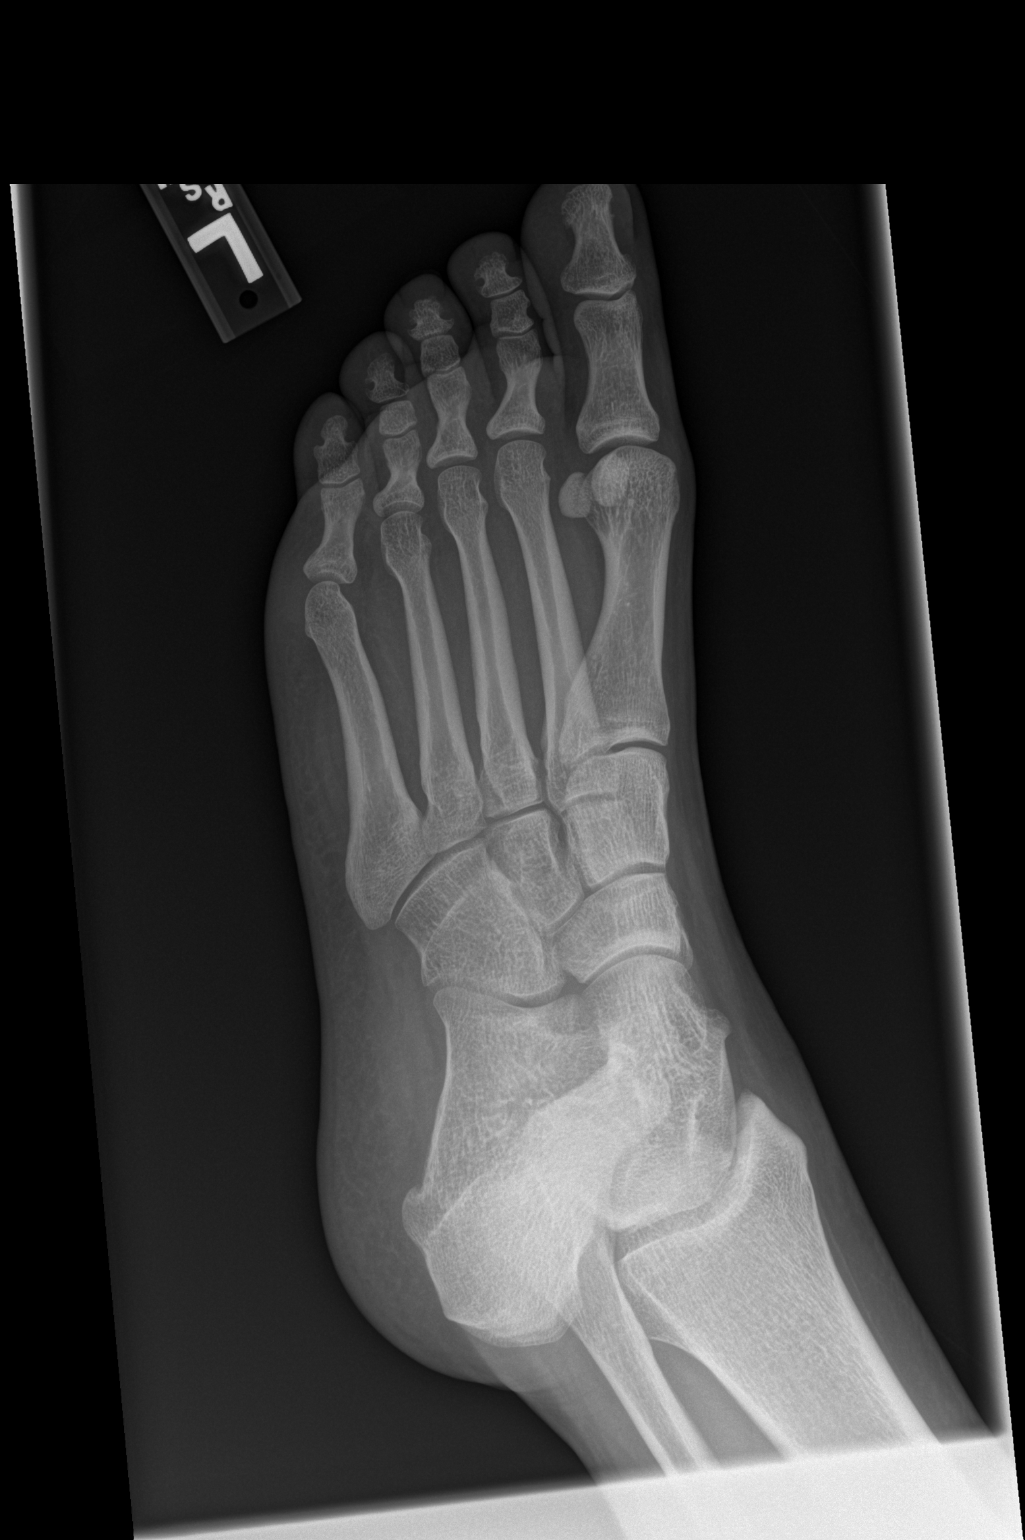

[x foot lat left]
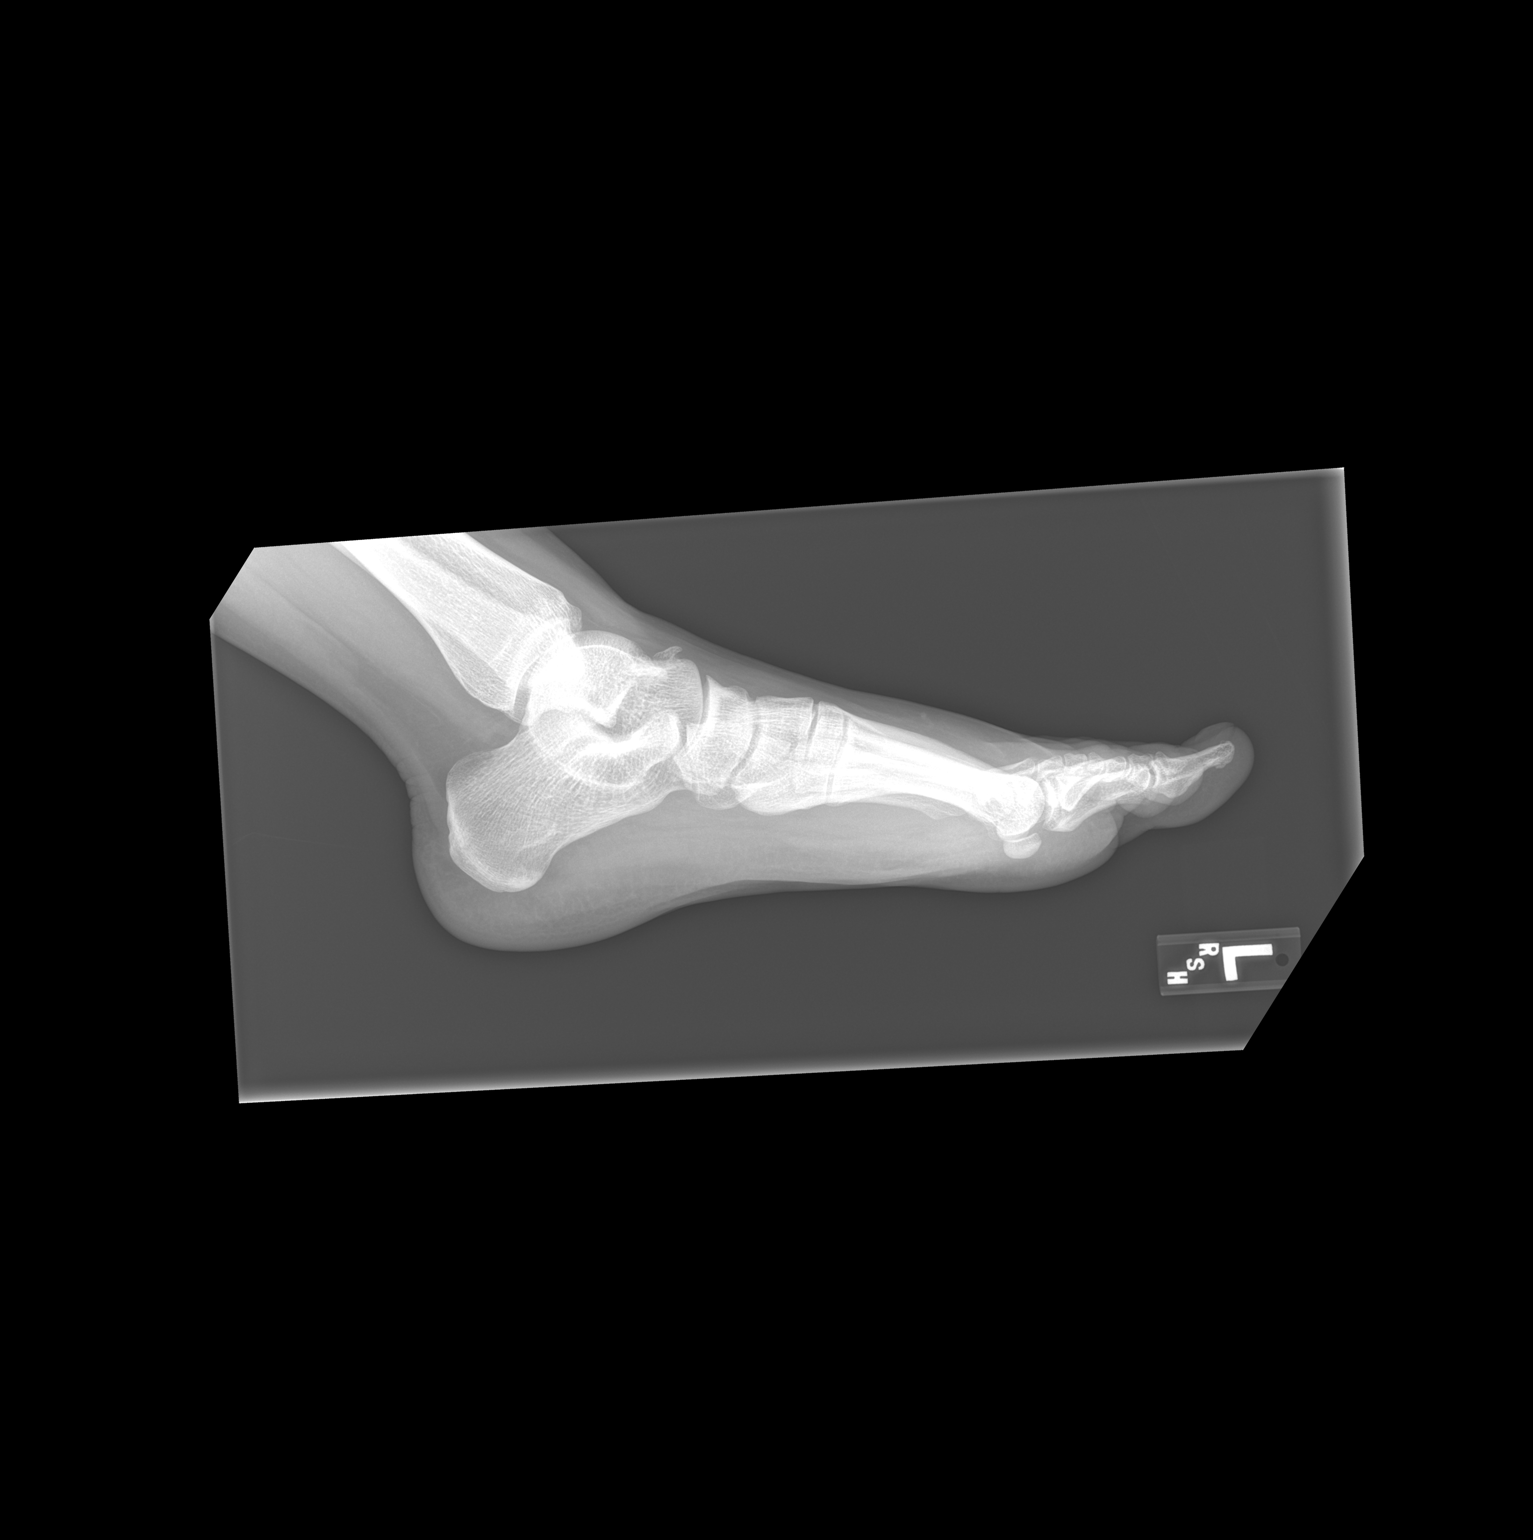

[3 of 3 positions shown; findings below may reference images not displayed]

FINDINGS: Frontal, oblique, and lateral views were obtained. There is no
fracture or dislocation. Joint spaces appear intact. There is a
small exostosis arising from the dorsal distal talus.
IMPRESSION: Exostosis arising from dorsal talus. No fracture or appreciable
arthropathy.
